# Patient Record
Sex: Male | Born: 1960 | Race: Black or African American | Hispanic: No | Marital: Married | Smoking: Never smoker
Health system: Southern US, Community
[De-identification: ages and names within clinical notes are randomized; demographics above are authoritative.]

## PROBLEM LIST (undated history)

## (undated) DIAGNOSIS — E119 Type 2 diabetes mellitus without complications: Secondary | ICD-10-CM

## (undated) DIAGNOSIS — I10 Essential (primary) hypertension: Secondary | ICD-10-CM

## (undated) HISTORY — PX: MANDIBLE SURGERY: SHX707

---

## 2015-03-20 ENCOUNTER — Emergency Department (HOSPITAL_BASED_OUTPATIENT_CLINIC_OR_DEPARTMENT_OTHER)
Admission: EM | Admit: 2015-03-20 | Discharge: 2015-03-20 | Disposition: A | Discharge status: Home / Self Care | Payer: 59 | Attending: Emergency Medicine | Admitting: Emergency Medicine

## 2015-03-20 ENCOUNTER — Emergency Department (HOSPITAL_BASED_OUTPATIENT_CLINIC_OR_DEPARTMENT_OTHER): Payer: 59

## 2015-03-20 ENCOUNTER — Encounter (HOSPITAL_BASED_OUTPATIENT_CLINIC_OR_DEPARTMENT_OTHER): Payer: Self-pay | Admitting: Emergency Medicine

## 2015-03-20 DIAGNOSIS — I10 Essential (primary) hypertension: Secondary | ICD-10-CM | POA: Diagnosis not present

## 2015-03-20 DIAGNOSIS — Z7984 Long term (current) use of oral hypoglycemic drugs: Secondary | ICD-10-CM | POA: Insufficient documentation

## 2015-03-20 DIAGNOSIS — E119 Type 2 diabetes mellitus without complications: Secondary | ICD-10-CM | POA: Diagnosis not present

## 2015-03-20 DIAGNOSIS — R519 Headache, unspecified: Secondary | ICD-10-CM

## 2015-03-20 DIAGNOSIS — R51 Headache: Secondary | ICD-10-CM | POA: Insufficient documentation

## 2015-03-20 DIAGNOSIS — Z79899 Other long term (current) drug therapy: Secondary | ICD-10-CM | POA: Insufficient documentation

## 2015-03-20 HISTORY — DX: Essential (primary) hypertension: I10

## 2015-03-20 HISTORY — DX: Type 2 diabetes mellitus without complications: E11.9

## 2015-03-20 LAB — CBC WITH DIFFERENTIAL/PLATELET
BASOS ABS: 0 10*3/uL (ref 0.0–0.1)
BASOS PCT: 1 %
EOS ABS: 0.3 10*3/uL (ref 0.0–0.7)
EOS PCT: 3 %
HCT: 39.4 % (ref 39.0–52.0)
Hemoglobin: 13 g/dL (ref 13.0–17.0)
Lymphocytes Relative: 36 %
Lymphs Abs: 2.7 10*3/uL (ref 0.7–4.0)
MCH: 24.4 pg — ABNORMAL LOW (ref 26.0–34.0)
MCHC: 33 g/dL (ref 30.0–36.0)
MCV: 74.1 fL — AB (ref 78.0–100.0)
MONO ABS: 0.6 10*3/uL (ref 0.1–1.0)
Monocytes Relative: 8 %
Neutro Abs: 3.9 10*3/uL (ref 1.7–7.7)
Neutrophils Relative %: 52 %
PLATELETS: 276 10*3/uL (ref 150–400)
RBC: 5.32 MIL/uL (ref 4.22–5.81)
RDW: 15.6 % — AB (ref 11.5–15.5)
WBC: 7.5 10*3/uL (ref 4.0–10.5)

## 2015-03-20 LAB — COMPREHENSIVE METABOLIC PANEL
ALBUMIN: 3.6 g/dL (ref 3.5–5.0)
ALT: 21 U/L (ref 17–63)
AST: 19 U/L (ref 15–41)
Alkaline Phosphatase: 46 U/L (ref 38–126)
Anion gap: 8 (ref 5–15)
BUN: 13 mg/dL (ref 6–20)
CO2: 26 mmol/L (ref 22–32)
Calcium: 8.5 mg/dL — ABNORMAL LOW (ref 8.9–10.3)
Chloride: 109 mmol/L (ref 101–111)
Creatinine, Ser: 1.04 mg/dL (ref 0.61–1.24)
GFR calc Af Amer: >60 mL/min (ref >60)
Glucose, Bld: 215 mg/dL — ABNORMAL HIGH (ref 65–99)
POTASSIUM: 3.5 mmol/L (ref 3.5–5.1)
SODIUM: 143 mmol/L (ref 135–145)
Total Bilirubin: 0.4 mg/dL (ref 0.3–1.2)
Total Protein: 6.9 g/dL (ref 6.5–8.1)

## 2015-03-20 LAB — SEDIMENTATION RATE: SED RATE: 11 mm/h (ref 0–16)

## 2015-03-20 LAB — CBG MONITORING, ED: GLUCOSE-CAPILLARY: 178 mg/dL — AB (ref 65–99)

## 2015-03-20 MED ORDER — METOCLOPRAMIDE HCL 5 MG/ML IJ SOLN
10.0000 mg | Freq: Once | INTRAMUSCULAR | Status: AC
  Administered 2015-03-20: 10 mg via INTRAVENOUS
  Filled 2015-03-20: qty 2

## 2015-03-20 MED ORDER — KETOROLAC TROMETHAMINE 30 MG/ML IJ SOLN
30.0000 mg | Freq: Once | INTRAMUSCULAR | Status: AC
  Administered 2015-03-20: 30 mg via INTRAVENOUS
  Filled 2015-03-20: qty 1

## 2015-03-20 MED ORDER — DIPHENHYDRAMINE HCL 50 MG/ML IJ SOLN
25.0000 mg | Freq: Once | INTRAMUSCULAR | Status: AC
  Administered 2015-03-20: 25 mg via INTRAVENOUS
  Filled 2015-03-20: qty 1

## 2015-03-20 MED ORDER — NAPROXEN 500 MG PO TABS: 500.0000 mg | ORAL_TABLET | Freq: Two times a day (BID) | ORAL | Status: AC

## 2015-03-20 MED ORDER — SODIUM CHLORIDE 0.9 % IV BOLUS (SEPSIS)
1000.0000 mL | Freq: Once | INTRAVENOUS | Status: AC
  Administered 2015-03-20: 1000 mL via INTRAVENOUS

## 2015-03-20 NOTE — Discharge Instructions (Signed)
Take your prescriptions as prescribed as needed for pain relief. Follow-up with the your primary care provider in the next 3-4 days. Return to the emergency department if symptoms worsen or new onset of fever, chills, neck stiffness, change in vision, numbness, tingling, weakness.   Emergency Department Resource Guide 1) Find a Doctor and Pay Out of Pocket Although you won't have to find out who is covered by your insurance plan, it is a good idea to ask around and get recommendations. You will then need to call the office and see if the doctor you have chosen will accept you as a new patient and what types of options they offer for patients who are self-pay. Some doctors offer discounts or will set up payment plans for their patients who do not have insurance, but you will need to ask so you aren't surprised when you get to your appointment.  2) Contact Your Local Health Department Not all health departments have doctors that can see patients for sick visits, but many do, so it is worth a call to see if yours does. If you don't know where your local health department is, you can check in your phone book. The CDC also has a tool to help you locate your state's health department, and many state websites also have listings of all of their local health departments.  3) Find a Walk-in Clinic If your illness is not likely to be very severe or complicated, you may want to try a walk in clinic. These are popping up all over the country in pharmacies, drugstores, and shopping centers. They're usually staffed by nurse practitioners or physician assistants that have been trained to treat common illnesses and complaints. They're usually fairly quick and inexpensive. However, if you have serious medical issues or chronic medical problems, these are probably not your best option.  No Primary Care Doctor: - Call Health Connect at  [phone] - they can help you locate a primary care doctor that  accepts your  insurance, provides certain services, etc. - Physician Referral Service- [phone]  Chronic Pain Problems: Organization         Address  Phone   Notes  [facility]  [phone] Patients need to be referred by their primary care doctor.   Medication Assistance: Organization         Address  Phone   Notes  [facility] [address] [place] [place], [place] [address] [phone] --Must be a resident of [facility] -- Must have NO insurance coverage whatsoever (no Medicaid/ Medicare, etc.) -- The pt. MUST have a primary care doctor that directs their care regularly and follows them in the community   [facility]ist  [phone]   [employer]  [phone]    Agencies that provide inexpensive medical care: Organization         Address  Phone   Notes  [facility]  [phone]   [facility]    [phone]   [facility] [address] [place], [place] [address] [phone]   [facility] [address] [place], [place] [phone]   Planned Parenthood    [phone]   [facility]    [phone]   [facility]  [address],  Phone:  [phone], Fax:  [phone] Hours of Operation:  9 am - 6 pm, M-F.  Also accepts Medicaid/Medicare and self-pay.  [facility] for [place], [address], [place] Phone: [phone], Fax: [phone]. Hours of Operation:  8:30 am - 5:30 pm, M-F.  Also accepts Medicaid and self-pay.  [facility] [place] [address], [place] Phone: [phone]   [place], [place], [place] [phone], Ext. [phone] Mondays & Thursdays: 7-9 AM.  First 15 patients are seen on a first come, first serve basis.    [place] Providers:  Organization          Address  Phone   Notes  [facility] [address], [place] [phone] Also accepts self-pay patients.  [facility] [address], [place]  [phone]   [place], [address], [place] [phone]   [facility] [address], [place] [phone]   Lucianne Lei [address], [place]   [phone] Only accepts [place] Access [place] patients after they have their name applied to their card.   Self-Pay (no insurance) in [facility]:  Organization         Address  Phone   Notes  Sickle Cell Patients, [facility] [place] [phone]   [facility] Urgent Care [place] [phone]   [facility] [place]  [place], [place], [place] [phone]   [facility]/Dr. Osei-Bonsu  [address] [place] or [address], [place] [phone] Phone number for both [place] and [place] locations is the same.  [facility]gent [facility] and [facility] [address], [place] [phone]   [facility] [address], [place] or [address] [phone]   [facility] [address] [place] [phone], phone; [phone], fax Sees patients 1st and 3rd Saturday of every month.  Must not qualify for public or private insurance (i.e. Medicaid, Medicare, [place] Health Choice, Veterans' Benefits)  Household income should be no more than 200% of the poverty level The clinic cannot treat you if you are pregnant or think you are pregnant  Sexually transmitted diseases are not treated at the clinic.    Dental Care: Organization         Address  Phone  Notes  [facility] Department of [facility] [place] [phone] Accepts children up to age 40 who are enrolled in [place] or [place]; pregnant women with a Medicaid card; and children who have applied for Medicaid or [place] Health Choice, but were declined, whose parents can pay a reduced fee at time of service.  [facility] Department of [facility]  [address], [place] [phone] Accepts children up to age 10 who are enrolled in [place] or [place]; pregnant women with a Medicaid card; and children who have applied for Medicaid or [place] Health Choice, but were declined, whose parents can pay a reduced fee at time of service.  [place] Adult Dental Access PROGRAM  [place] [phone] Patients are seen by appointment only. Walk-ins are not accepted. [place] will see patients 12 years of age and older. Monday - Tuesday (  8am-5pm) Most Wednesdays (8:30-5pm) $30 per visit, cash only  [facility] Adult Dental Access PROGRAM  [address], [facility] [phone] Patients are seen by appointment only. Walk-ins are not accepted. [place] will see patients 35 years of age and older. One Wednesday Evening (Monthly: Volunteer Based).  $30 per visit, cash only  [place]  [phone] for adults; Children under age 38, call Graduate Pediatric Dentistry at [phone]. Children aged 78-14, please call [phone] to request a pediatric application.  Dental services are provided in all areas of dental care including fillings, crowns and bridges, complete and partial dentures, implants, gum treatment, root canals, and extractions. Preventive care is also provided. Treatment is provided to both adults and children. Patients are selected via a lottery and there is often a waiting list.   [facility] [address] [place]  [phone] [email or web address]   [facility] [address] [place], [place]  [phone], Ext. [phone] Second and Fourth Thursday of each month, opens at 6:30 AM; Clinic ends at 9 AM.  Patients are seen on a first-come first-served basis, and a limited number are seen during each clinic.   [facility]  [address] Hillard Danker [place], [place] [phone]   Eligibility Requirements You must have lived in [place], [place], or [place] counties for at least the last three months.   You cannot be eligible for state or federal sponsored [employer], including [employer], [place], or [employer].   You generally cannot be eligible for healthcare insurance through your employer.    How to apply: Eligibility screenings are held every Tuesday and Wednesday afternoon from 1:00 pm until 4:00 pm. You do not need an appointment for the interview!  [facility] [address] [place], [place]   [place]  [place] Department  [place]  [phone]    Behavioral Health Resources in the Community: Intensive Outpatient Programs Organization         Address  Phone  Notes  [facility] [place]. [address] [place], [place] [phone]   [facility] [address] [place], [place]   ADS: Alcohol & Drug Svcs [address], [place], [place]   [place] [address]  [place], [place] or [phone]   Substance Abuse Resources Organization         Address  Phone  Notes  Alcohol and Drug Services  [phone]   [place]  [phone]   [facility]   [place]  [phone]   Residential & Outpatient Substance Abuse Program  [phone]   Psychological Services Organization         Address  Phone  Notes  [facility] [place]  [place]  [phone]    [place] [address], [place] or [phone]    Mobile Crisis Teams Organization         Address  Phone  Notes  [facility], Mobile Crisis Care Unit  [phone]   [facility]  [address] [place], [place]   [place] [address] [place] [phone]    Self-Help/Support Groups Organization         Address  Phone  Notes  [facility]ental [facility]oc[facility] of [place] - variety of support groups  336- [phone] Call for more information  Narcotics Anonymous (NA), [facility] [address], [place] [address] [place]  2 meetings at this location   Statisticianesidential Treatment Programs Organization         Address  Phone  Notes  [facility] [address],    [place]  [phone]   [facility]  [address], [place] [phone], [place], [place] [phone]   [facility] [address], [place] [phone] Admissions: 8am-3pm M-F  [facility] [address]    [place], [place] [phone]   [facility] [address], [place], [place] [phone]   [facility] [address]  [place], [place] [phone]   [facility] - Intensive Outpatient [address], [place], [place] [phone]   [facility] ([facility] [address]  [place], [place] [phone] or [phone]   [facility] (RTS) [address] [place], [place] [phone] Accepts Medicaid  [facility] [address]  [place] [phone] Substance Abuse/Addiction Treatment   [facility] Behavioral Health Resources Organization         Address  Phone  Notes  [facility]  [phone]) [phone]   Angie FavaJulie Brannon, PhD [address] [place], [place]   [phone](336) [phone] or [phone]) [phone]   [facility]   [address] [place], [place] [phone]) [phone]   [facility] [address], [place], [place] [phone](336) [phone] Insurance/Medicaid/sponsorship through [facility]  [facility] [address]                                    [place], [place] [phone]) [phone] Therapy/tele-psych/case  [facility] [address].   [place], [place] [phone]) [phone]    Dr. Lolly MustacheArfeen  [phone]) [phone]   [facility]  [employer] [facility] 1) [address], [place] 2) [address], [place] 3)  [address], [place] [phone]) [phone]  [phone]) [phone]   [facility] Child Abuse Hotline [phone](336) [phone] or [phone]) [phone] (After Hours)

## 2015-03-20 NOTE — ED Provider Notes (Signed)
CSN: [id number]     Arrival date & time 03/20/15  1833 History   First MD Initiated Contact with Patient 03/20/15 1943     Chief Complaint  Patient presents with  . Headache     (Consider location/radiation/quality/duration/timing/severity/associated sxs/prior Treatment) HPI Comments: Patient is a 54 year old male who presents to the ED with complaint of intermittent headache, onset 4 days. Patient reports having intermittent throbbing headache to his right forehead and temporal region. Denies fever, chills, visual changes, photophobia, neck stiffness, SOB, cough, CP, abdominal pain, N/V, numbness, tingling, weakness. Denies prior history of similar headaches. Mild relief with Motrin at home.  Patient is a 54 y.o. male presenting with headaches.  Headache   Past Medical History  Diagnosis Date  . Hypertension   . Diabetes mellitus without complication [facility] [place])    Past Surgical History  Procedure Laterality Date  . Mandible surgery     History reviewed. No pertinent family history. Social History  Substance Use Topics  . Smoking status: Never Smoker   . Smokeless tobacco: None  . Alcohol Use: No    Review of Systems  Neurological: Positive for headaches.  All other systems reviewed and are negative.     Allergies  Review of patient's allergies indicates no known allergies.  Home Medications   Prior to Admission medications   Medication Sig Start Date End Date Taking? Authorizing Provider  acarbose (PRECOSE) 50 MG tablet Take 50 mg by mouth 3 (three) times daily with meals.   Yes Historical Provider, MD  amLODipine (NORVASC) 10 MG tablet Take 10 mg by mouth daily.   Yes Historical Provider, MD  glimepiride (AMARYL) 4 MG tablet Take 4 mg by mouth 2 (two) times daily.   Yes Historical Provider, MD  glucose blood test strip 1 each by Other route as needed for other. Use as instructed   Yes Historical Provider, MD  metFORMIN (GLUCOPHAGE) 1000 MG tablet Take 1,000 mg by  mouth 2 (two) times daily with a meal.   Yes Historical Provider, MD  metoprolol (LOPRESSOR) 100 MG tablet Take 150 mg by mouth 2 (two) times daily.   Yes Historical Provider, MD  omeprazole (PRILOSEC) 20 MG capsule Take 20 mg by mouth daily.   Yes Historical Provider, MD  valsartan (DIOVAN) 320 MG tablet Take 320 mg by mouth daily.   Yes Historical Provider, MD   BP 138/93 mmHg  Pulse 92  Temp(Src) 98.3 F (36.8 C) (Oral)  Resp 16  Ht 5' 10"  (1.778 m)  Wt 254 lb (115.214 kg)  BMI 36.45 kg/m2  SpO2 100% Physical Exam  Constitutional: He is oriented to person, place, and time. He appears well-developed and well-nourished.  HENT:  Head: Normocephalic and atraumatic.  Right Ear: Tympanic membrane normal.  Left Ear: Tympanic membrane normal.  Nose: Right sinus exhibits frontal sinus tenderness. Right sinus exhibits no maxillary sinus tenderness. Left sinus exhibits no maxillary sinus tenderness and no frontal sinus tenderness.  Mouth/Throat: Uvula is midline, oropharynx is clear and moist and mucous membranes are normal. No oropharyngeal exudate.  TTP at right frontal region, right temporal region.  Eyes: Conjunctivae and EOM are normal. Pupils are equal, round, and reactive to light. Right eye exhibits no discharge. Left eye exhibits no discharge. No scleral icterus.  Neck: Normal range of motion. Neck supple.  Cardiovascular: Normal rate, regular rhythm, normal heart sounds and intact distal pulses.   Pulmonary/Chest: Effort normal and breath sounds normal. No respiratory distress. He has no wheezes. He has no  rales. He exhibits no tenderness.  Abdominal: Soft. Bowel sounds are normal. He exhibits no distension and no mass. There is no tenderness. There is no rebound and no guarding.  Musculoskeletal: Normal range of motion. He exhibits no edema or tenderness.  Patient able to stand and ambulate and room.  Lymphadenopathy:    He has no cervical adenopathy.  Neurological: He is alert and  oriented to person, place, and time. He has normal strength and normal reflexes. No cranial nerve deficit or sensory deficit. Coordination and gait normal.  Skin: Skin is warm and dry.  Nursing note and vitals reviewed.   ED Course  Procedures (including critical care time) Labs Review Labs Reviewed - No data to display  Imaging Review No results found. I have personally reviewed and evaluated these images and lab results as part of my medical decision-making.  Filed Vitals:   03/20/15 2113  BP: 117/86  Pulse: 86  Temp: 98.4 F (36.9 C)  Resp: 18     MDM   Final diagnoses:  Nonintractable headache, unspecified chronicity pattern, unspecified headache type    Patient presents with gradual onset of right-sided headache that started 4 days ago. Reports gradual worsening of headache. Mild relief with Motrin at home. Denies fever, chills, neck stiffness, numbness, tingling or weakness, visual changes. Exam reveals TTP at right frontal and temporal region, no neuro deficits, patient able to stand and ambulate and room without difficulty. CBC and CMP unremarkable. ESR 11. CT head negative. I do not suspect intracranial lesion, meningitis, encephalopathy or encephalitis and do not think that cranial imaging is needed at this time.  Patient reports improvement of headache with Toradol given in the ED. Plan to DC patient home with NSAID. Advised patient to follow up with PCP this week.  Evaluation does not show pathology requring ongoing emergent intervention or admission. Pt is hemodynamically stable and mentating appropriately. Discussed findings/results and plan with patient/guardian, who agrees with plan. All questions answered. Return precautions discussed and outpatient follow up given.      Chesley Noon [place], [place] 03/20/15 Diablo Yao, MD 03/21/15 [phone]

## 2015-03-20 NOTE — ED Notes (Signed)
Patient transported to CT 

## 2015-03-20 NOTE — ED Notes (Signed)
Fingerstick 178

## 2015-03-20 NOTE — ED Notes (Signed)
Patient states that he is having intermittent Headaches to his right side his head since Sunday

## 2015-03-20 NOTE — ED Notes (Signed)
MD at bedside. 

## 2015-03-21 ENCOUNTER — Emergency Department (HOSPITAL_BASED_OUTPATIENT_CLINIC_OR_DEPARTMENT_OTHER)
Admission: EM | Admit: 2015-03-21 | Discharge: 2015-03-21 | Disposition: A | Discharge status: Home / Self Care | Payer: 59 | Attending: Emergency Medicine | Admitting: Emergency Medicine

## 2015-03-21 ENCOUNTER — Encounter (HOSPITAL_BASED_OUTPATIENT_CLINIC_OR_DEPARTMENT_OTHER): Payer: Self-pay

## 2015-03-21 DIAGNOSIS — R21 Rash and other nonspecific skin eruption: Secondary | ICD-10-CM | POA: Diagnosis present

## 2015-03-21 DIAGNOSIS — Z791 Long term (current) use of non-steroidal anti-inflammatories (NSAID): Secondary | ICD-10-CM | POA: Diagnosis not present

## 2015-03-21 DIAGNOSIS — Z79899 Other long term (current) drug therapy: Secondary | ICD-10-CM | POA: Insufficient documentation

## 2015-03-21 DIAGNOSIS — E119 Type 2 diabetes mellitus without complications: Secondary | ICD-10-CM | POA: Insufficient documentation

## 2015-03-21 DIAGNOSIS — I10 Essential (primary) hypertension: Secondary | ICD-10-CM | POA: Insufficient documentation

## 2015-03-21 DIAGNOSIS — B029 Zoster without complications: Secondary | ICD-10-CM | POA: Diagnosis not present

## 2015-03-21 MED ORDER — HYDROCODONE-ACETAMINOPHEN 5-325 MG PO TABS: 1.0000 | ORAL_TABLET | Freq: Four times a day (QID) | ORAL | Status: AC | PRN

## 2015-03-21 MED ORDER — FLUORESCEIN SODIUM 1 MG OP STRP
ORAL_STRIP | OPHTHALMIC | Status: AC
  Administered 2015-03-21: 1
  Filled 2015-03-21: qty 1

## 2015-03-21 MED ORDER — VALACYCLOVIR HCL 500 MG PO TABS
1000.0000 mg | ORAL_TABLET | Freq: Once | ORAL | Status: AC
  Administered 2015-03-21: 1000 mg via ORAL
  Filled 2015-03-21: qty 2

## 2015-03-21 MED ORDER — HYDROCODONE-ACETAMINOPHEN 5-325 MG PO TABS
1.0000 | ORAL_TABLET | Freq: Once | ORAL | Status: AC
  Administered 2015-03-21: 1 via ORAL
  Filled 2015-03-21: qty 1

## 2015-03-21 MED ORDER — VALACYCLOVIR HCL 1 G PO TABS: 1000.0000 mg | ORAL_TABLET | Freq: Three times a day (TID) | ORAL | Status: AC

## 2015-03-21 NOTE — ED Provider Notes (Signed)
CSN: [id number]     Arrival date & time 03/21/15  2239 History  By signing my name below, I, Gwenyth Oberatherine Macek, attest that this documentation has been prepared under the direction and in the presence of Paula LibraJohn Roselyne Stalnaker, MD.  Electronically Signed: Gwenyth Oberatherine Macek, ED Scribe. 03/21/2015. 11:29 PM.   Chief Complaint  Patient presents with  . Rash   The history is provided by the patient. No language interpreter was used.    HPI Comments: Kenneth Gibbs is a 54 y.o. male who presents to the Emergency Department complaining of a moderate to severely painful rash to his right scalp and right forehead that started today. He states right-sided HA that started 5 days ago and right eye pain as associated symptoms. Pt was seen in the ED yesterday for right-sided HA and had unremarkable CT Head, CBC and CMP. Pt denies double vision, fever and chills. Pain is worse with palpation of the involved skin.  Past Medical History  Diagnosis Date  . Hypertension   . Diabetes mellitus without complication [facility](HCC)    Past Surgical History  Procedure Laterality Date  . Mandible surgery     No family history on file. Social History  Substance Use Topics  . Smoking status: Never Smoker   . Smokeless tobacco: None  . Alcohol Use: No    Review of Systems  A complete 10 system review of systems was obtained and all systems are negative except as noted in the HPI and PMH.   Allergies  Review of patient's allergies indicates no known allergies.  Home Medications   Prior to Admission medications   Medication Sig Start Date End Date Taking? Authorizing Provider  acarbose (PRECOSE) 50 MG tablet Take 50 mg by mouth 3 (three) times daily with meals.    Historical Provider, MD  amLODipine (NORVASC) 10 MG tablet Take 10 mg by mouth daily.    Historical Provider, MD  glimepiride (AMARYL) 4 MG tablet Take 4 mg by mouth 2 (two) times daily.    Historical Provider, MD  glucose blood test strip 1 each by Other route  as needed for other. Use as instructed    Historical Provider, MD  HYDROcodone-acetaminophen (NORCO/VICODIN) 5-325 MG tablet Take 1-2 tablets by mouth every 6 (six) hours as needed (for pain). 03/21/15   Jamani Bearce, MD  metFORMIN (GLUCOPHAGE) 1000 MG tablet Take 1,000 mg by mouth 2 (two) times daily with a meal.    Historical Provider, MD  metoprolol (LOPRESSOR) 100 MG tablet Take 150 mg by mouth 2 (two) times daily.    Historical Provider, MD  naproxen (NAPROSYN) 500 MG tablet Take 1 tablet (500 mg total) by mouth 2 (two) times daily. 03/20/15   Barrett HenleNicole Elizabeth Nadeau, PA-C  omeprazole (PRILOSEC) 20 MG capsule Take 20 mg by mouth daily.    Historical Provider, MD  valACYclovir (VALTREX) 1000 MG tablet Take 1 tablet (1,000 mg total) by mouth 3 (three) times daily. 03/21/15 04/04/15  Bessie Boyte, MD  valsartan (DIOVAN) 320 MG tablet Take 320 mg by mouth daily.    Historical Provider, MD   BP 162/89 mmHg  Pulse 92  Temp(Src) 98.1 F (36.7 C) (Oral)  Resp 18  SpO2 98%   Physical Exam  Nursing note and vitals reviewed. General: Well-developed, well-nourished male in no acute distress; appearance consistent with age of record HENT: normocephalic; atraumatic; hyperesthesia of the skin innervated by the first branch of the trigeminal nerve Eyes: pupils equal, round and reactive to light; extraocular muscles intact;  no dendritic pattern seen on fluorescein installation  Neck: supple Heart: regular rate and rhythm Lungs: clear to auscultation bilaterally Abdomen: soft; nondistended; nontender; no masses or hepatosplenomegaly; bowel sounds present Extremities: No deformity; full range of motion; pulses normal Neurologic: Awake, alert and oriented; motor function intact in all extremities and symmetric; no facial droop Skin: Warm and dry; vesicular rash in the right trigeminal nerve I distribution   Psychiatric: Normal mood and affect  ED Course  Procedures  DIAGNOSTIC STUDIES: Oxygen  Saturation is 98% on RA, normal by my interpretation.    COORDINATION OF CARE: 11:28 PM Discussed treatment plan with pt which includes anti-viral medication and pain management. Pt to follow up with his eye doctor Evorn Gong in [place] tomorrow. Pt agreed to plan.   MDM   Final diagnoses:  Shingles   I personally performed the services described in this documentation, which was scribed in my presence. The recorded information has been reviewed and is accurate.   Paula Libra, MD 03/21/15 [phone]

## 2015-03-21 NOTE — Discharge Instructions (Signed)

## 2015-03-21 NOTE — ED Notes (Signed)
Pt c/o rash to face, around rt eye and rt side facial pain onset this pm

## 2015-03-21 NOTE — ED Notes (Signed)
C/o facial rash since 330pm -was seen here yesterday for HA

## 2016-03-27 IMAGING — CT CT HEAD W/O CM
1 series · 16 of 30 positions shown, 20 images · non-contrast
Comparison: None.

CLINICAL DATA: Headache for 4 days

EXAM:
CT HEAD WITHOUT CONTRAST
TECHNIQUE: Contiguous axial images were obtained from the base of the skull
through the vertex without intravenous contrast.

[Series 2: head 4.8 h37s · axial · 0.47mm/px · z∈[-168,-16]mm · 16 of 36 slices shown, 20 images]
[im 2/36  brain]
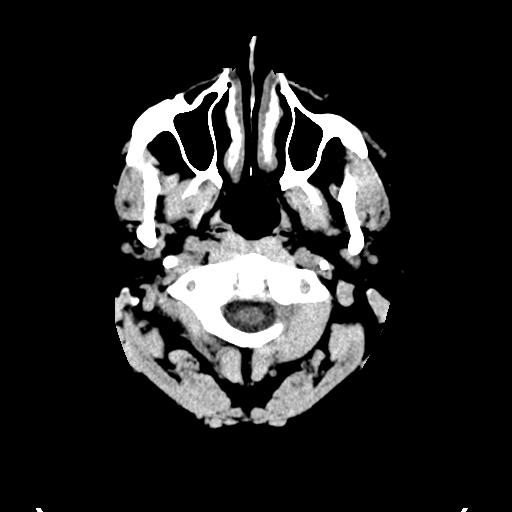
[im 2/36  bone]
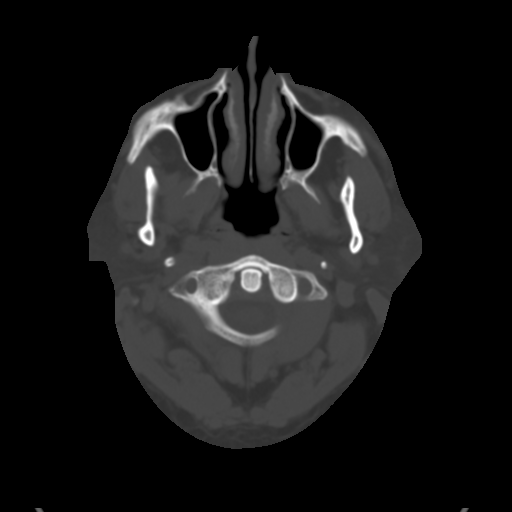
[im 4/36  brain]
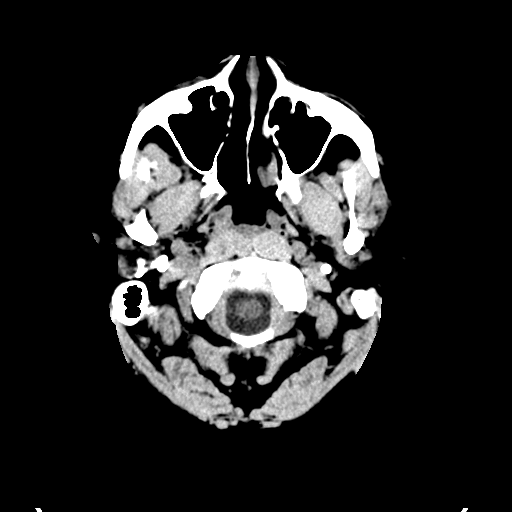
[im 7/36  brain]
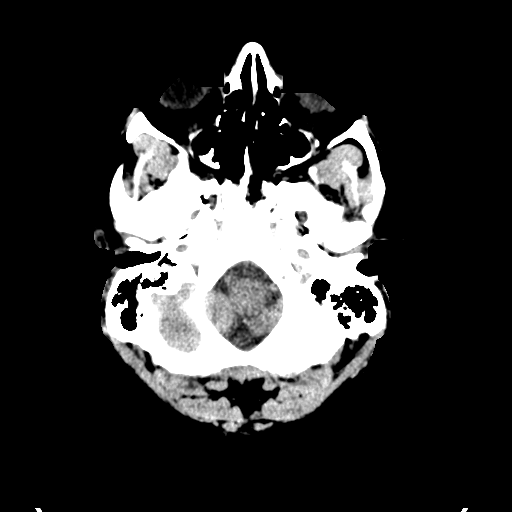
[im 9/36  brain]
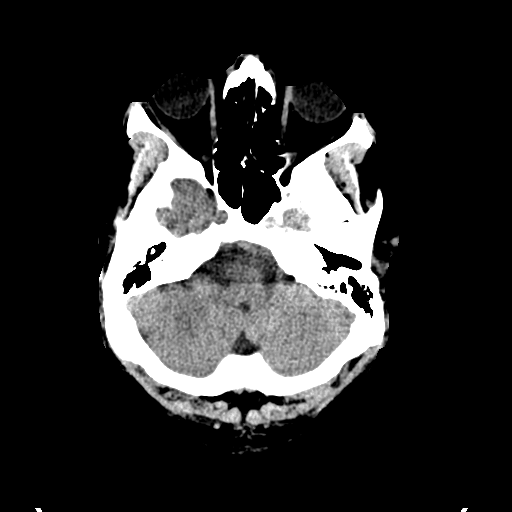
[im 10/36  brain]
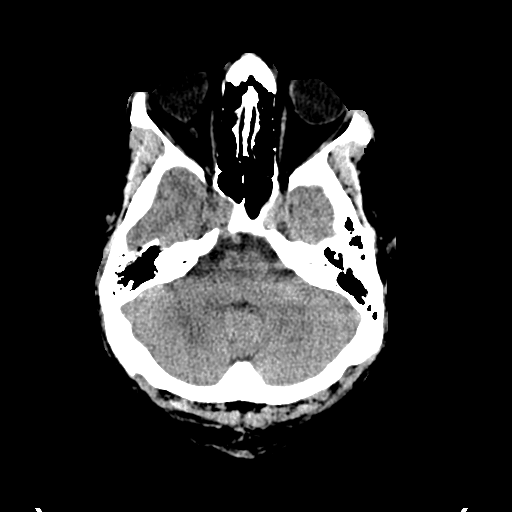
[im 10/36  bone]
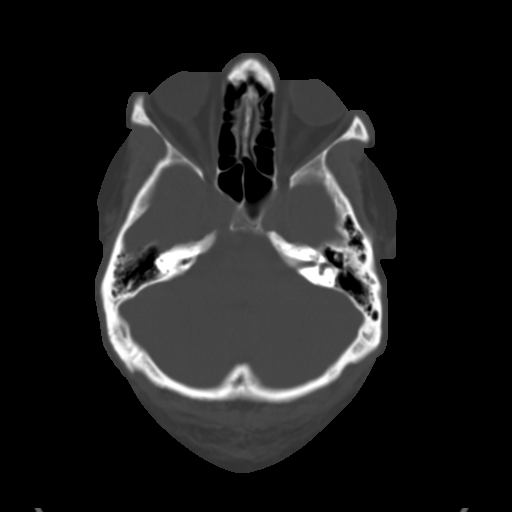
[im 13/36  brain]
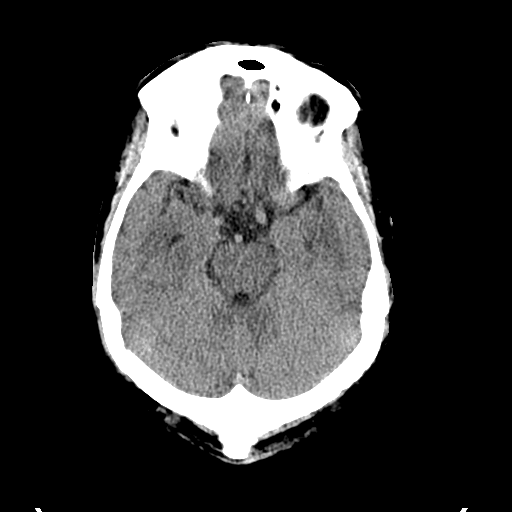
[im 15/36  brain]
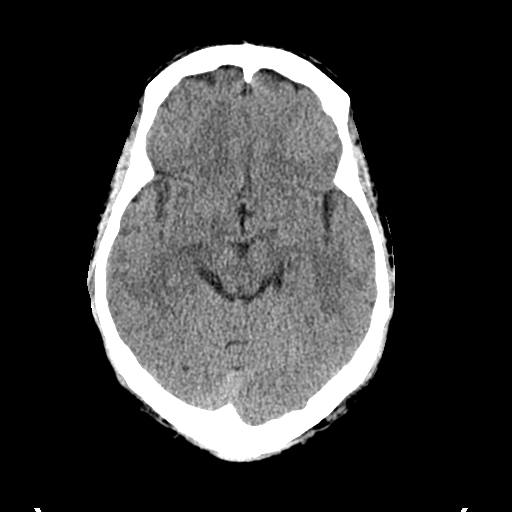
[im 17/36  brain]
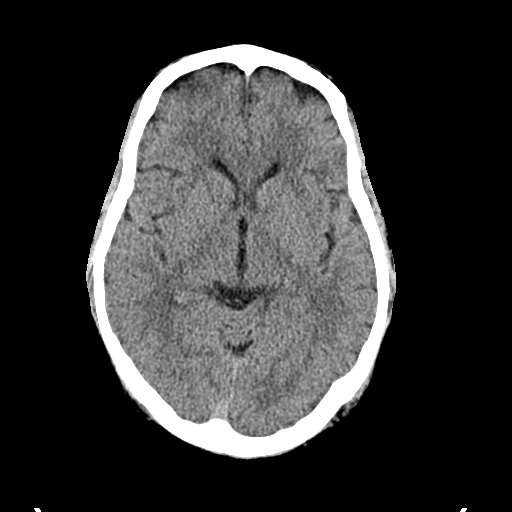
[im 19/36  brain]
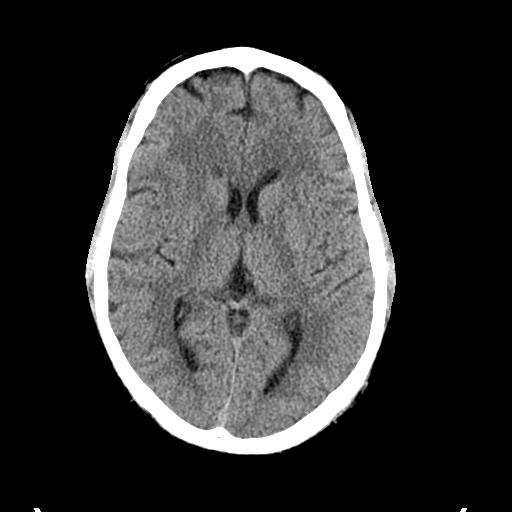
[im 19/36  bone]
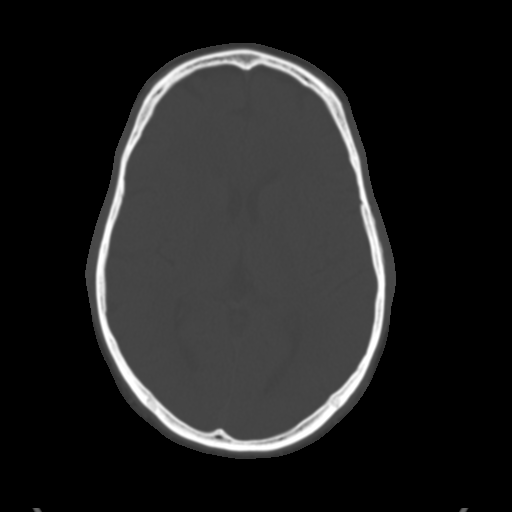
[im 21/36  brain]
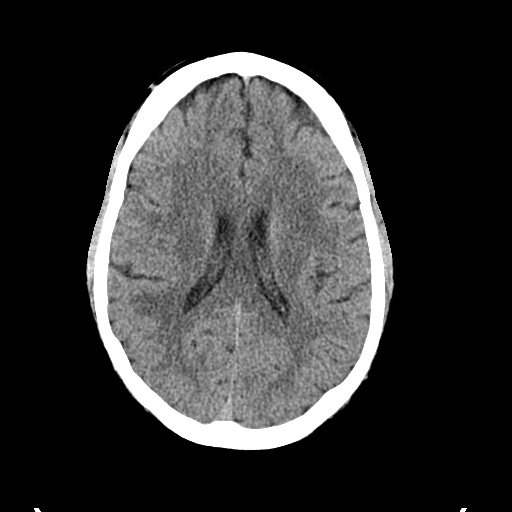
[im 23/36  brain]
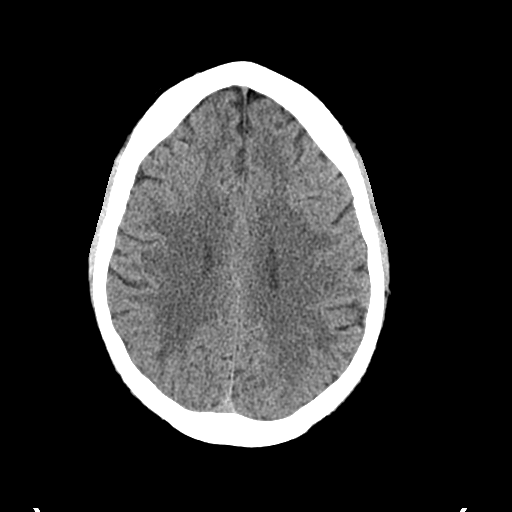
[im 26/36  brain]
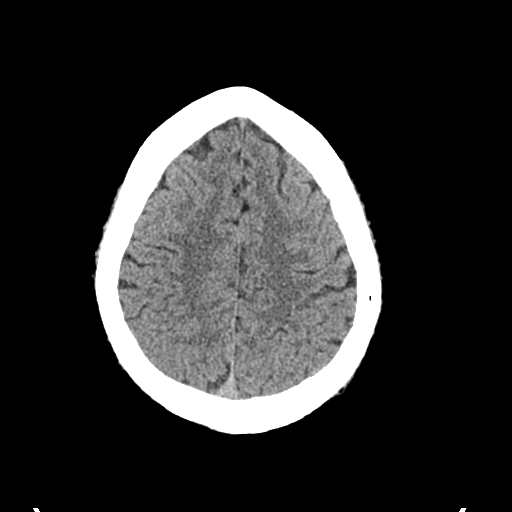
[im 27/36  brain]
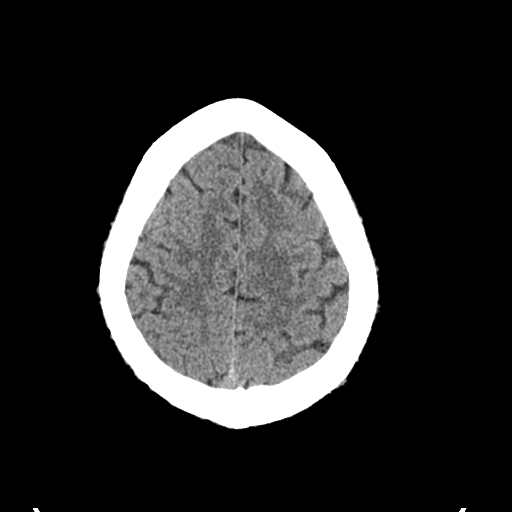
[im 27/36  bone]
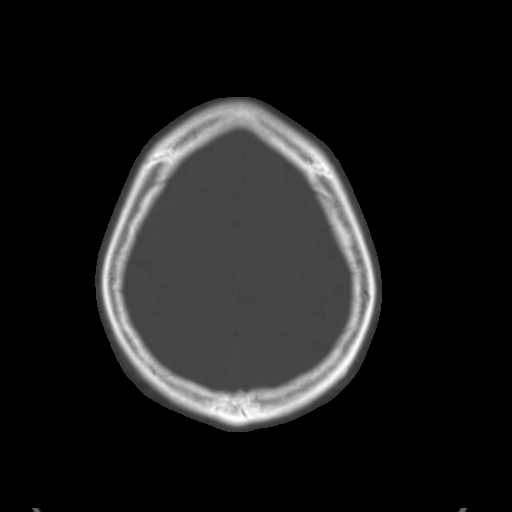
[im 29/36  brain]
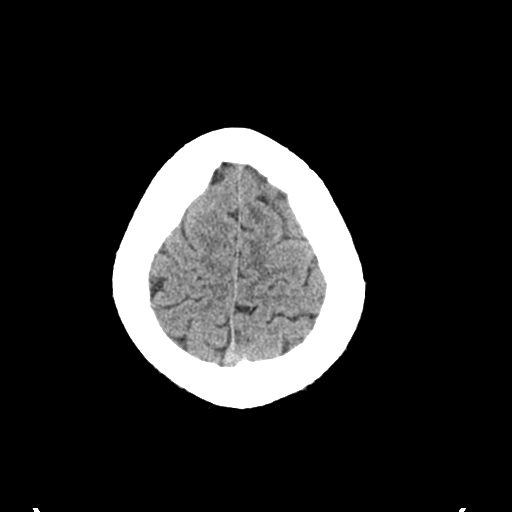
[im 32/36  brain]
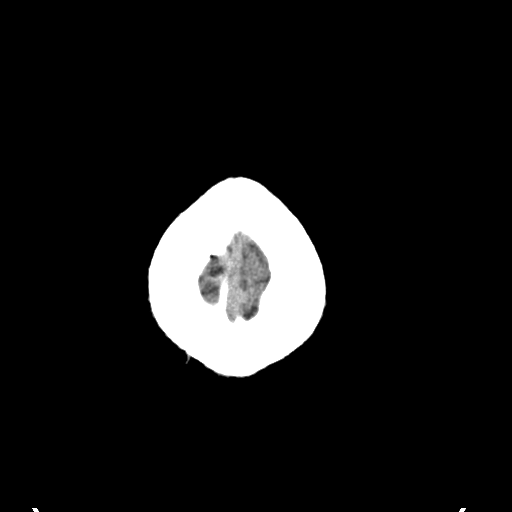
[im 34/36  brain]
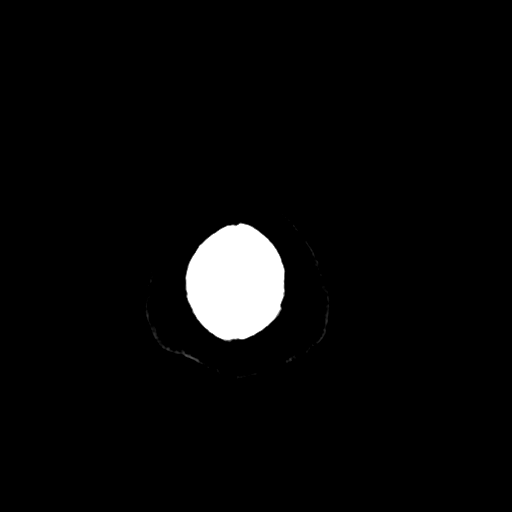

[16 of 30 positions shown; findings below may reference images not displayed]

FINDINGS: The ventricles are normal in size and configuration. There is no
intracranial mass, hemorrhage, extra-axial fluid collection, or
midline shift. Gray-white compartments are normal. No acute infarct
evident. The bony calvarium appears intact. The mastoid air cells
are clear.
IMPRESSION: Study within normal limits.
